# Patient Record
Sex: Female | Born: 1970 | Race: White | Hispanic: No | Marital: Married | Smoking: Never smoker
Health system: Southern US, Community
[De-identification: ages and names within clinical notes are randomized; demographics above are authoritative.]

## PROBLEM LIST (undated history)

## (undated) DIAGNOSIS — K635 Polyp of colon: Secondary | ICD-10-CM

## (undated) DIAGNOSIS — E7800 Pure hypercholesterolemia, unspecified: Secondary | ICD-10-CM

## (undated) HISTORY — DX: Pure hypercholesterolemia, unspecified: E78.00

---

## 2010-05-01 ENCOUNTER — Ambulatory Visit: Payer: Self-pay | Admitting: Oncology

## 2010-05-09 LAB — CBC WITH DIFFERENTIAL/PLATELET
Eosinophils Absolute: 0 10*3/uL (ref 0.0–0.5)
HGB: 14.3 g/dL (ref 11.6–15.9)
LYMPH%: 42 % (ref 14.0–49.7)
MONO#: 0.3 10*3/uL (ref 0.1–0.9)
NEUT#: 1.7 10*3/uL (ref 1.5–6.5)
Platelets: 234 10*3/uL (ref 145–400)
RBC: 4.37 10*6/uL (ref 3.70–5.45)
RDW: 12.1 % (ref 11.2–14.5)
WBC: 3.5 10*3/uL — ABNORMAL LOW (ref 3.9–10.3)

## 2010-05-09 LAB — PROTIME-INR
INR: 1 — ABNORMAL LOW (ref 2.00–3.50)
Protime: 12 Seconds (ref 10.6–13.4)

## 2010-05-10 LAB — COMPREHENSIVE METABOLIC PANEL
AST: 16 U/L (ref 0–37)
Albumin: 4.8 g/dL (ref 3.5–5.2)
Alkaline Phosphatase: 36 U/L — ABNORMAL LOW (ref 39–117)
Potassium: 4.3 mEq/L (ref 3.5–5.3)
Sodium: 139 mEq/L (ref 135–145)
Total Protein: 7.2 g/dL (ref 6.0–8.3)

## 2010-06-24 ENCOUNTER — Ambulatory Visit: Payer: Self-pay | Admitting: Oncology

## 2011-07-01 LAB — BASIC METABOLIC PANEL
BUN: 12 mg/dL (ref 4–21)
Creatinine: 0.7 mg/dL (ref 0.5–1.1)
Glucose: 90 mg/dL

## 2011-07-01 LAB — CBC AND DIFFERENTIAL
Platelets: 217 10*3/uL (ref 150–399)
WBC: 3.1 10^3/mL

## 2011-07-15 ENCOUNTER — Encounter (INDEPENDENT_AMBULATORY_CARE_PROVIDER_SITE_OTHER): Payer: Self-pay

## 2011-07-15 DIAGNOSIS — E7800 Pure hypercholesterolemia, unspecified: Secondary | ICD-10-CM | POA: Insufficient documentation

## 2011-07-15 DIAGNOSIS — R109 Unspecified abdominal pain: Secondary | ICD-10-CM | POA: Insufficient documentation

## 2011-07-31 ENCOUNTER — Ambulatory Visit (INDEPENDENT_AMBULATORY_CARE_PROVIDER_SITE_OTHER): Payer: Self-pay | Admitting: Internal Medicine

## 2013-06-21 ENCOUNTER — Encounter (INDEPENDENT_AMBULATORY_CARE_PROVIDER_SITE_OTHER): Payer: Self-pay | Admitting: *Deleted

## 2013-06-21 ENCOUNTER — Encounter (INDEPENDENT_AMBULATORY_CARE_PROVIDER_SITE_OTHER): Payer: Self-pay

## 2013-06-29 ENCOUNTER — Telehealth (INDEPENDENT_AMBULATORY_CARE_PROVIDER_SITE_OTHER): Payer: Self-pay | Admitting: *Deleted

## 2013-06-29 ENCOUNTER — Other Ambulatory Visit (INDEPENDENT_AMBULATORY_CARE_PROVIDER_SITE_OTHER): Payer: Self-pay | Admitting: *Deleted

## 2013-06-29 DIAGNOSIS — Z860100 Personal history of colon polyps, unspecified: Secondary | ICD-10-CM

## 2013-06-29 DIAGNOSIS — Z1211 Encounter for screening for malignant neoplasm of colon: Secondary | ICD-10-CM

## 2013-06-29 NOTE — Telephone Encounter (Signed)
Patient needs movi prep 

## 2013-07-05 MED ORDER — PEG-KCL-NACL-NASULF-NA ASC-C 100 G PO SOLR: 1.0000 | Freq: Once | ORAL | Status: DC

## 2013-07-12 ENCOUNTER — Telehealth (INDEPENDENT_AMBULATORY_CARE_PROVIDER_SITE_OTHER): Payer: Self-pay | Admitting: *Deleted

## 2013-07-12 NOTE — Telephone Encounter (Signed)
agree

## 2013-07-12 NOTE — Telephone Encounter (Signed)
  Procedure: tcs  Reason/Indication:  Hx polyps  Has patient had this procedure before?  2009 (scanned)  If so, when, by whom and where?    Is there a family history of colon cancer?  no  Who?  What age when diagnosed?    Is patient diabetic?   no      Does patient have prosthetic heart valve?  no  Do you have a pacemaker?  no  Has patient ever had endocarditis? no  Has patient had joint replacement within last 12 months?  no  Is patient on Coumadin, Plavix and/or Aspirin? no  Medications: altavera daily (birth control)  Allergies: nkda  Medication Adjustment:   Procedure date & time: 08/11/13 at 1200

## 2013-07-27 ENCOUNTER — Encounter (HOSPITAL_COMMUNITY): Payer: Self-pay | Admitting: Pharmacy Technician

## 2013-08-11 ENCOUNTER — Encounter (HOSPITAL_COMMUNITY): Payer: Self-pay | Admitting: *Deleted

## 2013-08-11 ENCOUNTER — Ambulatory Visit (HOSPITAL_COMMUNITY)
Admission: RE | Admit: 2013-08-11 | Discharge: 2013-08-11 | Disposition: A | Discharge status: Home / Self Care | Payer: BC Managed Care – PPO | Source: Ambulatory Visit | Attending: Internal Medicine | Admitting: Internal Medicine

## 2013-08-11 ENCOUNTER — Encounter (HOSPITAL_COMMUNITY)
Admission: RE | Disposition: A | Discharge status: Home / Self Care | Payer: Self-pay | Source: Ambulatory Visit | Attending: Internal Medicine

## 2013-08-11 DIAGNOSIS — D126 Benign neoplasm of colon, unspecified: Secondary | ICD-10-CM | POA: Insufficient documentation

## 2013-08-11 DIAGNOSIS — Z860100 Personal history of colon polyps, unspecified: Secondary | ICD-10-CM

## 2013-08-11 DIAGNOSIS — E7800 Pure hypercholesterolemia, unspecified: Secondary | ICD-10-CM | POA: Insufficient documentation

## 2013-08-11 HISTORY — PX: COLONOSCOPY: SHX5424

## 2013-08-11 HISTORY — DX: Polyp of colon: K63.5

## 2013-08-11 SURGERY — COLONOSCOPY
Anesthesia: Moderate Sedation

## 2013-08-11 MED ORDER — SODIUM CHLORIDE 0.9 % IV SOLN
INTRAVENOUS | Status: DC
Start: 2013-08-11 — End: 2013-08-11
  Administered 2013-08-11: 11:00:00 via INTRAVENOUS

## 2013-08-11 MED ORDER — MEPERIDINE HCL 50 MG/ML IJ SOLN
INTRAMUSCULAR | Status: AC
  Filled 2013-08-11: qty 1

## 2013-08-11 MED ORDER — MIDAZOLAM HCL 5 MG/5ML IJ SOLN
INTRAMUSCULAR | Status: AC
  Filled 2013-08-11: qty 10

## 2013-08-11 MED ORDER — STERILE WATER FOR IRRIGATION IR SOLN
Status: DC | PRN
  Administered 2013-08-11: 12:00:00

## 2013-08-11 MED ORDER — MIDAZOLAM HCL 5 MG/5ML IJ SOLN
INTRAMUSCULAR | Status: DC | PRN
  Administered 2013-08-11 (×2): 2 mg via INTRAVENOUS
  Administered 2013-08-11 (×3): 1 mg via INTRAVENOUS

## 2013-08-11 MED ORDER — MEPERIDINE HCL 50 MG/ML IJ SOLN
INTRAMUSCULAR | Status: DC | PRN
  Administered 2013-08-11 (×2): 25 mg via INTRAVENOUS

## 2013-08-11 NOTE — Op Note (Signed)
COLONOSCOPY PROCEDURE REPORT  PATIENT:  Joanna Villa  MR#:  [id number] Birthdate:  [birth year], 42 y.o., female Endoscopist:  Dr. Malissa Hippo, MD Referred By:  Dr. Ignatius Specking, MD Procedure Date: 08/11/2013  Procedure:   Colonoscopy  Indications:  Patient is 42 year old Caucasian female who underwent colonoscopy for hematochezia 5 years ago and was found to have small periappendiceal polyp. It was ablated via cold biopsy it turned out to be tubular adenoma. She is returning for surveillance colonoscopy.  Informed Consent:  The procedure and risks were reviewed with the patient and informed consent was obtained.  Medications:  Demerol 50 mg IV Versed 7 mg IV  Description of procedure:  After a digital rectal exam was performed, that colonoscope was advanced from the anus through the rectum and colon to the area of the cecum, ileocecal valve and appendiceal orifice. The cecum was deeply intubated. These structures were well-seen and photographed for the record. From the level of the cecum and ileocecal valve, the scope was slowly and cautiously withdrawn. The mucosal surfaces were carefully surveyed utilizing scope tip to flexion to facilitate fold flattening as needed. The scope was pulled down into the rectum where a thorough exam including retroflexion was performed.  Findings:   Prep excellent. Small polyp located close to appendiceal orifice. This polyp was felt to be possibly recurrent polyp. Polyp was ablated via cold biopsy. Site was coagulated with snare tip. Rest of the mucosa was normal. Normal rectal mucosa. Small hemorrhoids below the dentate line.   Therapeutic/Diagnostic Maneuvers Performed:  See above  Complications:  None  Cecal Withdrawal Time:  25 minutes  Impression:  Examination performed to cecum. Small periappendiceal polyp treated with combination of cold biopsy and coagulation. This polyp may have recurred at previous site due to incomplete excision  because of close proximity to appendiceal orifice  Recommendations:  Standard instructions given. I will contact patient with biopsy results and further recommendations.  REHMAN,NAJEEB U  08/11/2013 12:34 PM  CC: Dr. Ignatius Specking., MD & Dr. Bonnetta Barry ref. provider found

## 2013-08-11 NOTE — H&P (Signed)
Joanna Villa is an 42 y.o. female.   Chief Complaint: Patient's here for colonoscopy. HPI: Patient is 42 year old Caucasian female who was found to have small cecal adenoma on colonoscopy in July 2009. She is therefore returning for surveillance colonoscopy. She denies abdominal pain change in her bowel habits or rectal bleeding. Family history is negative for colorectal carcinoma.  Past Medical History  Diagnosis Date  . Hypercholesterolemia   . Colon polyps     Past Surgical History  Procedure Laterality Date  . Cesarean section      Family History  Problem Relation Age of Onset  . Colon cancer Neg Hx    Social History:  reports that she has never smoked. She has never used smokeless tobacco. She reports that she does not drink alcohol or use illicit drugs.  Allergies: No Known Allergies    No results found for this or any previous visit (from the past 48 hour(s)). No results found.  ROS  Blood pressure 115/76, pulse 77, temperature 97.7 F (36.5 C), temperature source Oral, resp. rate 16, height 5\' 4"  (1.626 m), weight 113 lb (51.256 kg), last menstrual period 08/02/2013, SpO2 100.00%. Physical Exam  Constitutional:  Well-developed thin Caucasian female in NAD  HENT:  Mouth/Throat: Oropharynx is clear and moist.  Eyes: Conjunctivae are normal. No scleral icterus.  Neck: No thyromegaly present.  Cardiovascular: Normal rate, regular rhythm and normal heart sounds.   No murmur heard. Respiratory: Effort normal and breath sounds normal.  GI: Soft. She exhibits no distension and no mass. There is no tenderness.  Musculoskeletal: She exhibits no edema.  Lymphadenopathy:    She has no cervical adenopathy.  Neurological: She is alert.  Skin: Skin is warm and dry.     Assessment/Plan History of cecal tubular adenoma.  Surveillance colonoscopy.  REHMAN,NAJEEB U 08/11/2013, 11:35 AM

## 2013-08-15 ENCOUNTER — Encounter (HOSPITAL_COMMUNITY): Payer: Self-pay | Admitting: Internal Medicine

## 2013-08-16 ENCOUNTER — Encounter (INDEPENDENT_AMBULATORY_CARE_PROVIDER_SITE_OTHER): Payer: Self-pay | Admitting: *Deleted

## 2019-07-21 ENCOUNTER — Ambulatory Visit (INDEPENDENT_AMBULATORY_CARE_PROVIDER_SITE_OTHER): Payer: BC Managed Care – PPO | Admitting: Otolaryngology

## 2019-07-21 DIAGNOSIS — R0982 Postnasal drip: Secondary | ICD-10-CM

## 2019-07-21 DIAGNOSIS — H6983 Other specified disorders of Eustachian tube, bilateral: Secondary | ICD-10-CM

## 2019-09-21 ENCOUNTER — Other Ambulatory Visit: Payer: Self-pay | Admitting: Unknown Physician Specialty

## 2019-09-21 DIAGNOSIS — R921 Mammographic calcification found on diagnostic imaging of breast: Secondary | ICD-10-CM

## 2019-09-22 ENCOUNTER — Ambulatory Visit (INDEPENDENT_AMBULATORY_CARE_PROVIDER_SITE_OTHER): Payer: BC Managed Care – PPO | Admitting: Otolaryngology

## 2019-09-22 DIAGNOSIS — H9209 Otalgia, unspecified ear: Secondary | ICD-10-CM

## 2019-09-22 DIAGNOSIS — J310 Chronic rhinitis: Secondary | ICD-10-CM | POA: Diagnosis not present

## 2019-09-29 DIAGNOSIS — C801 Malignant (primary) neoplasm, unspecified: Secondary | ICD-10-CM

## 2019-09-29 HISTORY — DX: Malignant (primary) neoplasm, unspecified: C80.1

## 2019-10-05 ENCOUNTER — Ambulatory Visit
Admission: RE | Admit: 2019-10-05 | Discharge: 2019-10-05 | Disposition: A | Discharge status: Home / Self Care | Payer: BC Managed Care – PPO | Source: Ambulatory Visit | Attending: Unknown Physician Specialty | Admitting: Unknown Physician Specialty

## 2019-10-05 ENCOUNTER — Other Ambulatory Visit: Payer: Self-pay

## 2019-10-05 DIAGNOSIS — R921 Mammographic calcification found on diagnostic imaging of breast: Secondary | ICD-10-CM

## 2019-11-29 HISTORY — PX: BREAST LUMPECTOMY: SHX2

## 2020-07-19 ENCOUNTER — Encounter (INDEPENDENT_AMBULATORY_CARE_PROVIDER_SITE_OTHER): Payer: Self-pay | Admitting: *Deleted

## 2020-08-14 ENCOUNTER — Other Ambulatory Visit (INDEPENDENT_AMBULATORY_CARE_PROVIDER_SITE_OTHER): Payer: Self-pay | Admitting: *Deleted

## 2020-08-14 DIAGNOSIS — Z86010 Personal history of colonic polyps: Secondary | ICD-10-CM

## 2020-09-15 IMAGING — MG MM BREAST LOCALIZATION CLIP
4 series · 4 of 12 positions shown · non-contrast
Comparison: Previous exam(s).

CLINICAL DATA: Post stereotactic guided biopsy of suspicious
calcifications in the upper slightly inner right breast.

EXAM:
DIAGNOSTIC RIGHT MAMMOGRAM POST STEREOTACTIC BIOPSY

[R ML synth-2D]
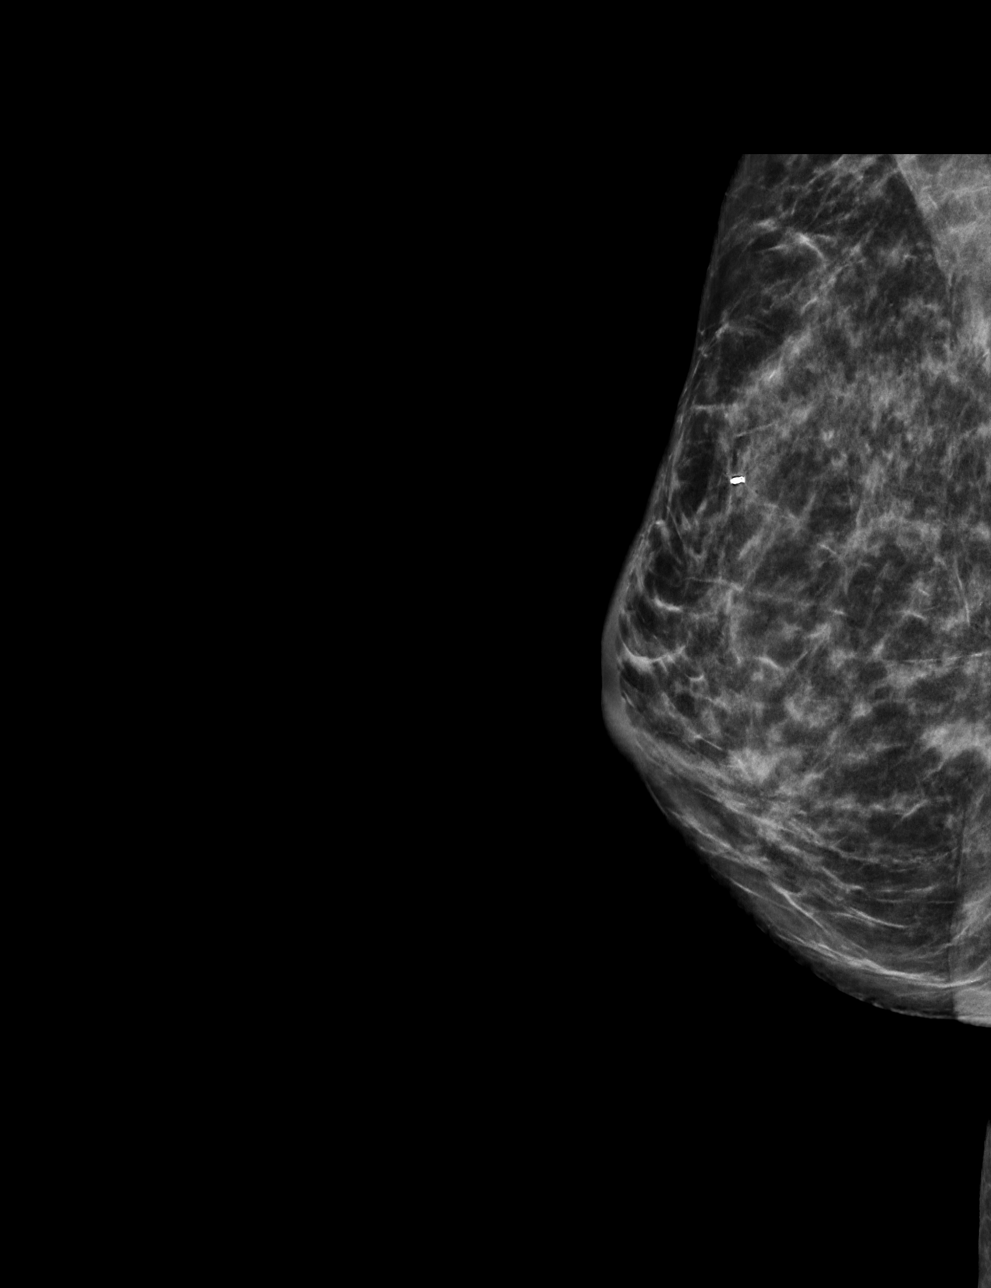

[R CC synth-2D]
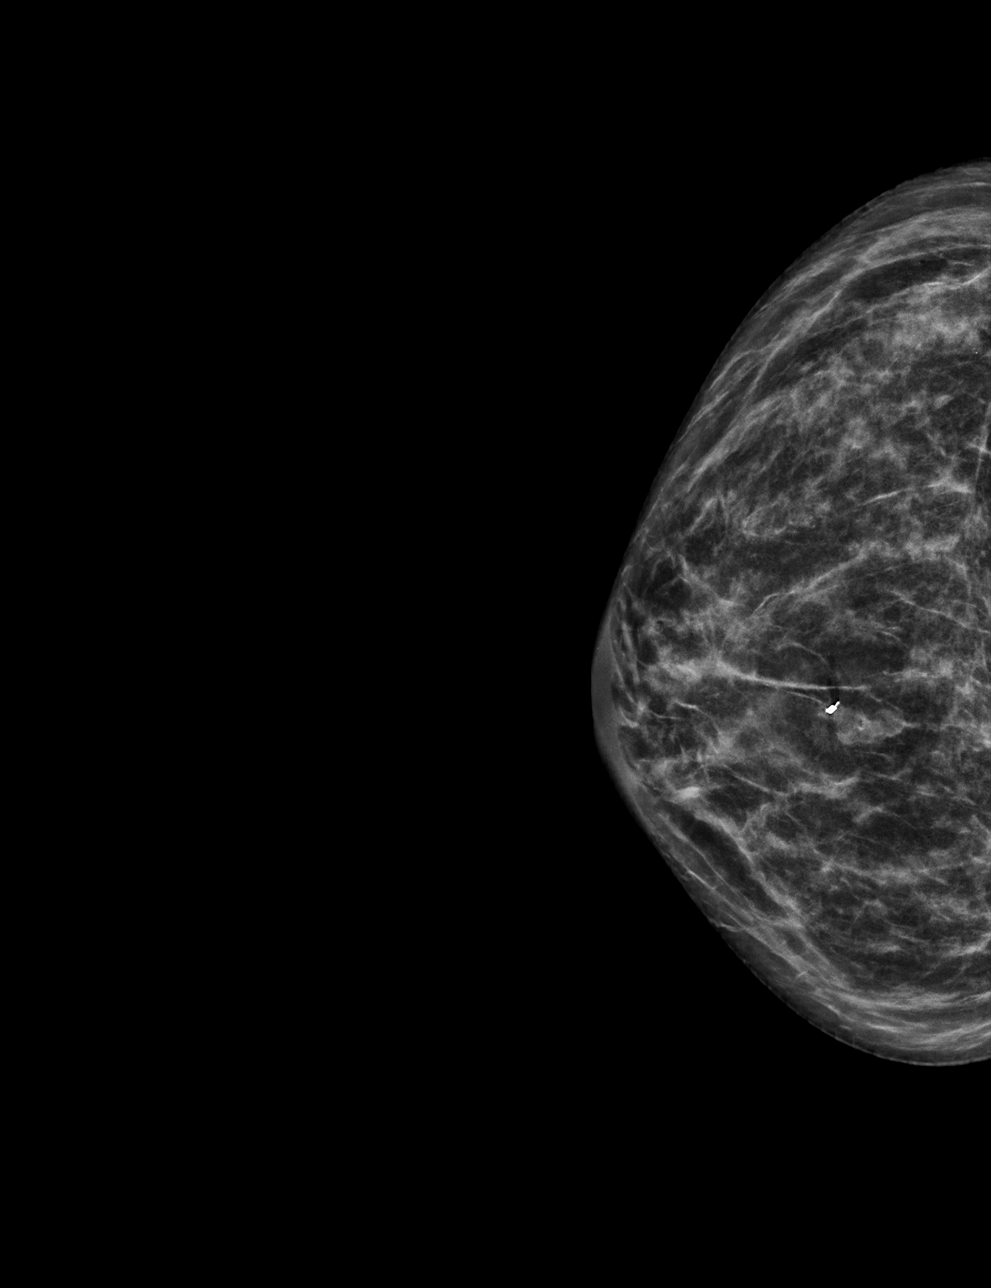

[R ML tomo · tomo slice 31/61.0]
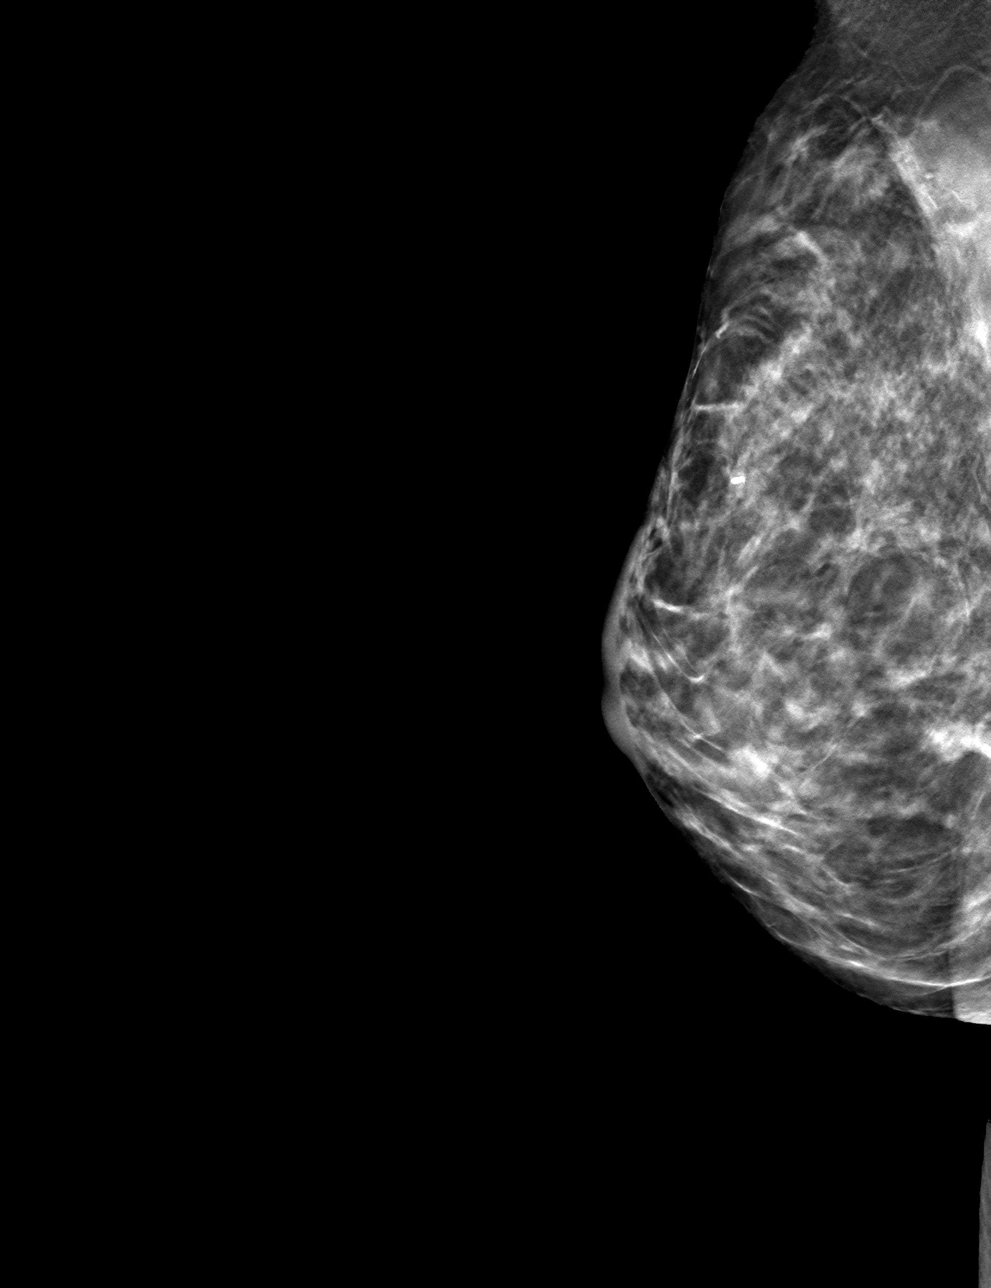

[R CC tomo · tomo slice 29/57.0]
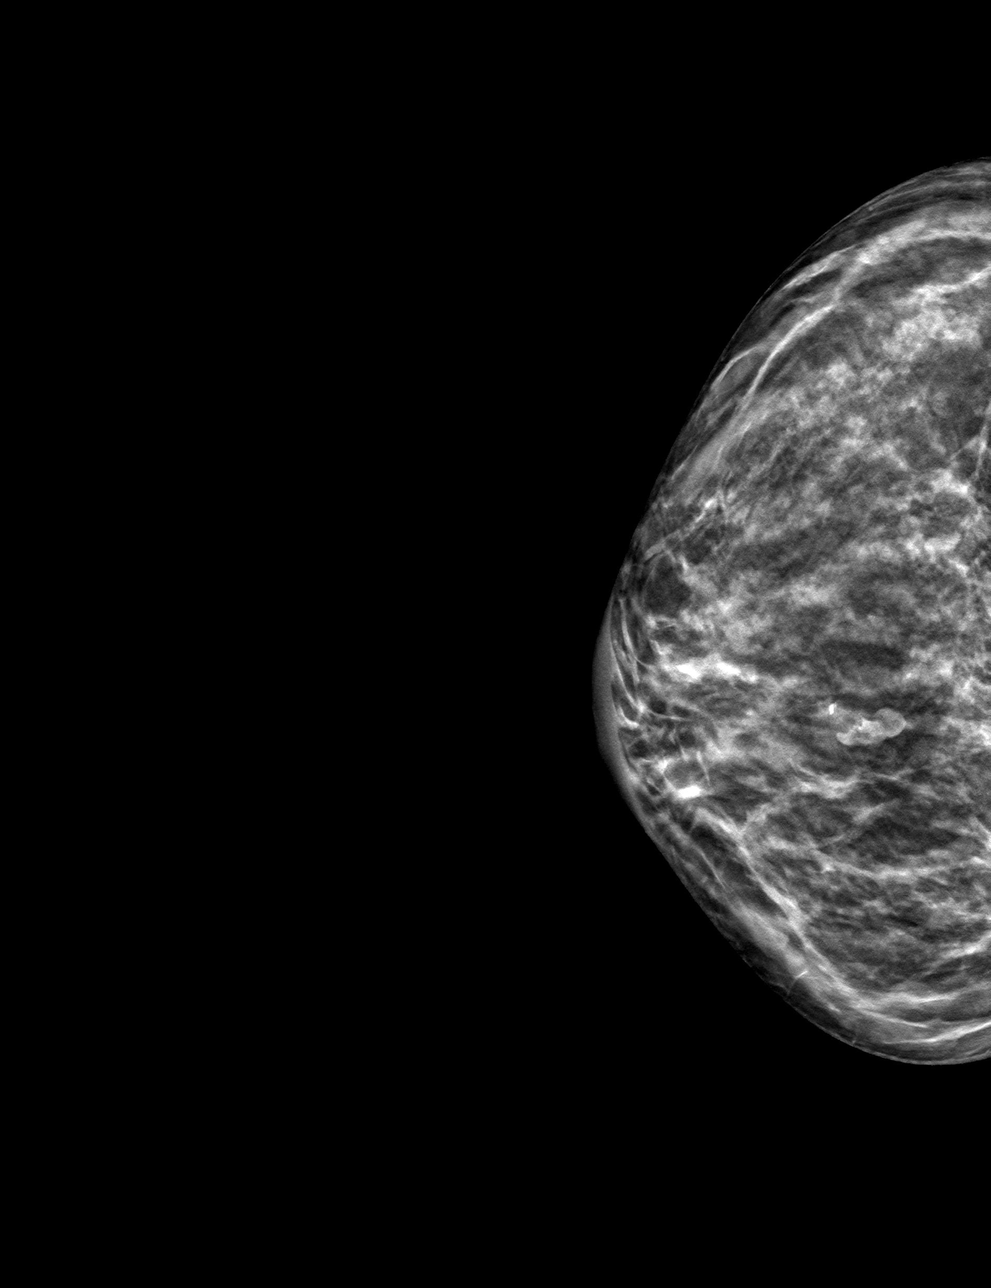

[4 of 12 positions shown; findings below may reference images not displayed]

FINDINGS: Mammographic images were obtained following stereotactic guided
biopsy of a 0.2 cm group of calcifications in the upper slightly
inner right breast. A coil shaped biopsy marking clip is present at
the site of the biopsied calcifications in the upper slightly inner
right breast.
IMPRESSION: Coil shaped biopsy marking clip at site of biopsied calcifications
in the upper slightly inner right breast.

Final Assessment: Post Procedure Mammograms for Marker Placement

## 2020-09-18 ENCOUNTER — Telehealth (INDEPENDENT_AMBULATORY_CARE_PROVIDER_SITE_OTHER): Payer: Self-pay | Admitting: *Deleted

## 2020-09-18 ENCOUNTER — Encounter (INDEPENDENT_AMBULATORY_CARE_PROVIDER_SITE_OTHER): Payer: Self-pay | Admitting: *Deleted

## 2020-09-18 MED ORDER — SUTAB 1479-225-188 MG PO TABS: 1.0000 | ORAL_TABLET | Freq: Once | ORAL | 0 refills | Status: AC

## 2020-09-18 NOTE — Telephone Encounter (Signed)
Patient needs Sutab (copay card) ° °

## 2020-09-18 NOTE — Telephone Encounter (Signed)
Referring MD/PCP: vyas   Procedure: tcs w mac  Reason/Indication:  Hx polyps  Has patient had this procedure before?  Yes, 2014  If so, when, by whom and where?    Is there a family history of colon cancer?  no  Who?  What age when diagnosed?    Is patient diabetic?   no      Does patient have prosthetic heart valve or mechanical valve?  no  Do you have a pacemaker/defibrillator?  no  Has patient ever had endocarditis/atrial fibrillation? no  Does patient use oxygen? no  Has patient had joint replacement within last 12 months?  no  Is patient constipated or do they take laxatives? no  Does patient have a history of alcohol/drug use?  no  Is patient on blood thinner such as Coumadin, Plavix and/or Aspirin? no  Medications: tomaxifen 20 mg daily  Allergies: pcn  Medication Adjustment per Dr Rehman/Dr Jenetta Downer   Procedure date & time: 10/17/20

## 2020-10-12 NOTE — Patient Instructions (Signed)
[facility]  10/12/2020     @PREFPERIOPPHARMACY @   Your procedure is scheduled on 10/20/52021.  Report to [facility] at  [phone]  A.M.  Call this number if you have problems the morning of surgery:  [phone]   Remember:  Follow the diet and prep instructions given to you by the office.                      Take these medicines the morning of surgery with A SIP OF WATER None    Do not wear jewelry, make-up or nail polish.  Do not wear lotions, powders, or perfumes. Please wear deodorant and brush your teeth.  Do not shave 48 hours prior to surgery.  Men may shave face and neck.  Do not bring valuables to the hospital.  [facility] is not responsible for any belongings or valuables.  Contacts, dentures or bridgework may not be worn into surgery.  Leave your suitcase in the car.  After surgery it may be brought to your room.  For patients admitted to the hospital, discharge time will be determined by your treatment team.  Patients discharged the day of surgery will not be allowed to drive home.   Name and phone number of your driver:   family Special instructions:   DO NOT smoke the day of your procedure.  Please read over the following fact sheets that you were given. Anesthesia Post-op Instructions and Care and Recovery After Surgery       Colonoscopy, Adult, Care After This sheet gives you information about how to care for yourself after your procedure. Your health care provider may also give you more specific instructions. If you have problems or questions, contact your health care provider. What can I expect after the procedure? After the procedure, it is common to have:  A small amount of blood in your stool for 24 hours after the procedure.  Some gas.  Mild cramping or bloating of your abdomen. Follow these instructions at home: Eating and drinking   Drink enough fluid to keep your urine pale yellow.  Follow instructions from your  health care provider about eating or drinking restrictions.  Resume your normal diet as instructed by your health care provider. Avoid heavy or fried foods that are hard to digest. Activity  Rest as told by your health care provider.  Avoid sitting for a long time without moving. Get up to take short walks every 1-2 hours. This is important to improve blood flow and breathing. Ask for help if you feel weak or unsteady.  Return to your normal activities as told by your health care provider. Ask your health care provider what activities are safe for you. Managing cramping and bloating   Try walking around when you have cramps or feel bloated.  Apply heat to your abdomen as told by your health care provider. Use the heat source that your health care provider recommends, such as a moist heat pack or a heating pad. ? Place a towel between your skin and the heat source. ? Leave the heat on for 20-30 minutes. ? Remove the heat if your skin turns bright red. This is especially important if you are unable to feel pain, heat, or cold. You may have a greater risk of getting burned. General instructions  For the first 24 hours after the procedure: ? Do not drive or use machinery. ? Do not sign important documents. ?  Do not drink alcohol. ? Do your regular daily activities at a slower pace than normal. ? Eat soft foods that are easy to digest.  Take over-the-counter and prescription medicines only as told by your health care provider.  Keep all follow-up visits as told by your health care provider. This is important. Contact a health care provider if:  You have blood in your stool 2-3 days after the procedure. Get help right away if you have:  More than a small spotting of blood in your stool.  Large blood clots in your stool.  Swelling of your abdomen.  Nausea or vomiting.  A fever.  Increasing pain in your abdomen that is not relieved with medicine. Summary  After the procedure,  it is common to have a small amount of blood in your stool. You may also have mild cramping and bloating of your abdomen.  For the first 24 hours after the procedure, do not drive or use machinery, sign important documents, or drink alcohol.  Get help right away if you have a lot of blood in your stool, nausea or vomiting, a fever, or increased pain in your abdomen. This information is not intended to replace advice given to you by your health care provider. Make sure you discuss any questions you have with your health care provider. Document Revised: 07/11/2019 Document Reviewed: 07/11/2019 Elsevier Patient Education  [place] After These instructions provide you with information about caring for yourself after your procedure. Your health care provider may also give you more specific instructions. Your treatment has been planned according to current medical practices, but problems sometimes occur. Call your health care provider if you have any problems or questions after your procedure. What can I expect after the procedure? After your procedure, you may:  Feel sleepy for several hours.  Feel clumsy and have poor balance for several hours.  Feel forgetful about what happened after the procedure.  Have poor judgment for several hours.  Feel nauseous or vomit.  Have a sore throat if you had a breathing tube during the procedure. Follow these instructions at home: For at least 24 hours after the procedure:      Have a responsible adult stay with you. It is important to have someone help care for you until you are awake and alert.  Rest as needed.  Do not: ? Participate in activities in which you could fall or become injured. ? Drive. ? Use heavy machinery. ? Drink alcohol. ? Take sleeping pills or medicines that cause drowsiness. ? Make important decisions or sign legal documents. ? Take care of children on your own. Eating and  drinking  Follow the diet that is recommended by your health care provider.  If you vomit, drink water, juice, or soup when you can drink without vomiting.  Make sure you have little or no nausea before eating solid foods. General instructions  Take over-the-counter and prescription medicines only as told by your health care provider.  If you have sleep apnea, surgery and certain medicines can increase your risk for breathing problems. Follow instructions from your health care provider about wearing your sleep device: ? Anytime you are sleeping, including during daytime naps. ? While taking prescription pain medicines, sleeping medicines, or medicines that make you drowsy.  If you smoke, do not smoke without supervision.  Keep all follow-up visits as told by your health care provider. This is important. Contact a health care provider if:  You keep  feeling nauseous or you keep vomiting.  You feel light-headed.  You develop a rash.  You have a fever. Get help right away if:  You have trouble breathing. Summary  For several hours after your procedure, you may feel sleepy and have poor judgment.  Have a responsible adult stay with you for at least 24 hours or until you are awake and alert. This information is not intended to replace advice given to you by your health care provider. Make sure you discuss any questions you have with your health care provider. Document Revised: 03/15/2018 Document Reviewed: 04/06/2016 Elsevier Patient Education  [place].

## 2020-10-15 ENCOUNTER — Encounter (HOSPITAL_COMMUNITY): Payer: Self-pay

## 2020-10-15 ENCOUNTER — Other Ambulatory Visit: Payer: Self-pay

## 2020-10-15 ENCOUNTER — Encounter (HOSPITAL_COMMUNITY)
Admission: RE | Admit: 2020-10-15 | Discharge: 2020-10-15 | Disposition: A | Discharge status: Home / Self Care | Payer: BC Managed Care – PPO | Source: Ambulatory Visit | Attending: Internal Medicine | Admitting: Internal Medicine

## 2020-10-15 ENCOUNTER — Other Ambulatory Visit (HOSPITAL_COMMUNITY)
Admission: RE | Admit: 2020-10-15 | Discharge: 2020-10-15 | Disposition: A | Discharge status: Home / Self Care | Payer: BC Managed Care – PPO | Source: Ambulatory Visit | Attending: Internal Medicine | Admitting: Internal Medicine

## 2020-10-15 DIAGNOSIS — Z20822 Contact with and (suspected) exposure to covid-19: Secondary | ICD-10-CM | POA: Diagnosis not present

## 2020-10-15 DIAGNOSIS — Z01812 Encounter for preprocedural laboratory examination: Secondary | ICD-10-CM | POA: Diagnosis present

## 2020-10-15 LAB — PREGNANCY, URINE: Preg Test, Ur: NEGATIVE

## 2020-10-16 LAB — SARS CORONAVIRUS 2 (TAT 6-24 HRS): SARS Coronavirus 2: NEGATIVE

## 2020-10-17 ENCOUNTER — Encounter (HOSPITAL_COMMUNITY): Payer: Self-pay | Admitting: Internal Medicine

## 2020-10-17 ENCOUNTER — Encounter (HOSPITAL_COMMUNITY)
Admission: RE | Disposition: A | Discharge status: Home / Self Care | Payer: Self-pay | Source: Home / Self Care | Attending: Internal Medicine

## 2020-10-17 ENCOUNTER — Ambulatory Visit (HOSPITAL_COMMUNITY): Payer: BC Managed Care – PPO | Admitting: Anesthesiology

## 2020-10-17 ENCOUNTER — Ambulatory Visit (HOSPITAL_COMMUNITY)
Admission: RE | Admit: 2020-10-17 | Discharge: 2020-10-17 | Disposition: A | Discharge status: Home / Self Care | Payer: BC Managed Care – PPO | Attending: Internal Medicine | Admitting: Internal Medicine

## 2020-10-17 DIAGNOSIS — Z880 Allergy status to penicillin: Secondary | ICD-10-CM | POA: Diagnosis not present

## 2020-10-17 DIAGNOSIS — K644 Residual hemorrhoidal skin tags: Secondary | ICD-10-CM | POA: Insufficient documentation

## 2020-10-17 DIAGNOSIS — Z86010 Personal history of colonic polyps: Secondary | ICD-10-CM | POA: Diagnosis not present

## 2020-10-17 DIAGNOSIS — D120 Benign neoplasm of cecum: Secondary | ICD-10-CM | POA: Insufficient documentation

## 2020-10-17 DIAGNOSIS — E7800 Pure hypercholesterolemia, unspecified: Secondary | ICD-10-CM | POA: Insufficient documentation

## 2020-10-17 DIAGNOSIS — Z79899 Other long term (current) drug therapy: Secondary | ICD-10-CM | POA: Insufficient documentation

## 2020-10-17 DIAGNOSIS — Z853 Personal history of malignant neoplasm of breast: Secondary | ICD-10-CM | POA: Insufficient documentation

## 2020-10-17 DIAGNOSIS — Z1211 Encounter for screening for malignant neoplasm of colon: Secondary | ICD-10-CM | POA: Diagnosis present

## 2020-10-17 HISTORY — PX: COLONOSCOPY WITH PROPOFOL: SHX5780

## 2020-10-17 HISTORY — PX: POLYPECTOMY: SHX5525

## 2020-10-17 LAB — HM COLONOSCOPY

## 2020-10-17 SURGERY — COLONOSCOPY WITH PROPOFOL
Anesthesia: General

## 2020-10-17 MED ORDER — CHLORHEXIDINE GLUCONATE CLOTH 2 % EX PADS: 6.0000 | MEDICATED_PAD | Freq: Once | CUTANEOUS | Status: DC

## 2020-10-17 MED ORDER — PROPOFOL 500 MG/50ML IV EMUL
INTRAVENOUS | Status: DC | PRN
  Administered 2020-10-17: 150 ug/kg/min via INTRAVENOUS

## 2020-10-17 MED ORDER — PROPOFOL 10 MG/ML IV BOLUS
INTRAVENOUS | Status: DC | PRN
  Administered 2020-10-17: 50 mg via INTRAVENOUS

## 2020-10-17 MED ORDER — LACTATED RINGERS IV SOLN
INTRAVENOUS | Status: DC
  Administered 2020-10-17: 1000 mL via INTRAVENOUS

## 2020-10-17 MED ORDER — PROPOFOL 10 MG/ML IV BOLUS
INTRAVENOUS | Status: AC
  Filled 2020-10-17: qty 40

## 2020-10-17 NOTE — Op Note (Signed)
[facility] Patient Name: Joanna Villa Procedure Date: 10/17/2020 7:16 AM MRN: [id number] Date of Birth: 1971/08/31 Attending MD: Hildred Laser , MD CSN: [id number] Age: 49 Admit Type: Outpatient Procedure:                Colonoscopy Indications:              High risk colon cancer surveillance: Personal                            history of colonic polyps Providers:                Hildred Laser, MD, Otis Peak B. Sharon Seller, RN, Raphael Gibney, Technician Referring MD:             Suzie Portela MD Medicines:                Propofol per Anesthesia Complications:            No immediate complications. Estimated Blood Loss:     Estimated blood loss was minimal. Procedure:                Pre-Anesthesia Assessment:                           - Prior to the procedure, a History and Physical                            was performed, and patient medications and                            allergies were reviewed. The patient's tolerance of                            previous anesthesia was also reviewed. The risks                            and benefits of the procedure and the sedation                            options and risks were discussed with the patient.                            All questions were answered, and informed consent                            was obtained. Prior Anticoagulants: The patient has                            taken no previous anticoagulant or antiplatelet                            agents. ASA Grade Assessment: II - A patient with  mild systemic disease. After reviewing the risks                            and benefits, the patient was deemed in                            satisfactory condition to undergo the procedure.                           After obtaining informed consent, the colonoscope                            was passed under direct vision. Throughout the                             procedure, the patient's blood pressure, pulse, and                            oxygen saturations were monitored continuously. The                            [id number]-[id number] ([id number]) scope was introduced through                            the anus and advanced to the the cecum, identified                            by appendiceal orifice and ileocecal valve. The                            colonoscopy was performed without difficulty. The                            patient tolerated the procedure well. The quality                            of the bowel preparation was excellent. The                            ileocecal valve, appendiceal orifice, and rectum                            were photographed. Scope In: 7:36:45 AM Scope Out: 7:52:45 AM Scope Withdrawal Time: 0 hours 8 minutes 2 seconds  Total Procedure Duration: 0 hours 16 minutes 0 seconds  Findings:      The perianal and digital rectal examinations were normal.      A 3 to 6 mm polyp was found in the cecum. The polyp was sessile. The       polyp was removed with a cold snare. Resection and retrieval were       complete.      The exam was otherwise normal throughout the examined colon.      External hemorrhoids were found during retroflexion. The hemorrhoids       were small. Impression:               -  One 3 to 6 mm polyp in the cecum, removed with a                            cold snare. Resected and retrieved.                           - External hemorrhoids.                           Comment: No residual polyp noted at appendiceal                            orifice. Moderate Sedation:      Per Anesthesia Care Recommendation:           - Patient has a contact number available for                            emergencies. The signs and symptoms of potential                            delayed complications were discussed with the                            patient. Return to normal activities tomorrow.                             Written discharge instructions were provided to the                            patient.                           - Resume previous diet today.                           - Continue present medications.                           - No aspirin, ibuprofen, naproxen, or other                            non-steroidal anti-inflammatory drugs for 1 day.                           - Await pathology results.                           - Repeat colonoscopy is recommended. The                            colonoscopy date will be determined after pathology                            results from today's exam become available for  review. Procedure Code(s):        --- Professional ---                           [id number], Colonoscopy, flexible; with removal of                            tumor(s), polyp(s), or other lesion(s) by snare                            technique Diagnosis Code(s):        --- Professional ---                           Z86.010, Personal history of colonic polyps                           K63.5, Polyp of colon                           K64.4, Residual hemorrhoidal skin tags CPT copyright 2019 American Medical Association. All rights reserved. The codes documented in this report are preliminary and upon coder review may  be revised to meet current compliance requirements. Hildred Laser, MD Hildred Laser, MD 10/17/2020 8:00:30 AM This report has been signed electronically. Number of Addenda: 0

## 2020-10-17 NOTE — Anesthesia Preprocedure Evaluation (Signed)
Anesthesia Evaluation  Patient identified by MRN, date of birth, ID band Patient awake    Reviewed: Allergy & Precautions, H&P , NPO status , Patient's Chart, lab work & pertinent test results, reviewed documented beta blocker date and time   Airway Mallampati: II  TM Distance: >3 FB Neck ROM: full    Dental no notable dental hx.    Pulmonary neg pulmonary ROS,    Pulmonary exam normal breath sounds clear to auscultation       Cardiovascular Exercise Tolerance: Good negative cardio ROS   Rhythm:regular Rate:Normal     Neuro/Psych negative neurological ROS  negative psych ROS   GI/Hepatic negative GI ROS, Neg liver ROS,   Endo/Other  negative endocrine ROS  Renal/GU negative Renal ROS  negative genitourinary   Musculoskeletal   Abdominal   Peds  Hematology negative hematology ROS (+)   Anesthesia Other Findings   Reproductive/Obstetrics negative OB ROS                             Anesthesia Physical Anesthesia Plan  ASA: II  Anesthesia Plan: General   Post-op Pain Management:    Induction:   PONV Risk Score and Plan: 3 and Propofol infusion  Airway Management Planned:   Additional Equipment:   Intra-op Plan:   Post-operative Plan:   Informed Consent: I have reviewed the patients History and Physical, chart, labs and discussed the procedure including the risks, benefits and alternatives for the proposed anesthesia with the patient or authorized representative who has indicated his/her understanding and acceptance.     Dental Advisory Given  Plan Discussed with: CRNA  Anesthesia Plan Comments:         Anesthesia Quick Evaluation

## 2020-10-17 NOTE — H&P (Signed)
Joanna Villa is an 49 y.o. female.   Chief Complaint: Patient is here for colonoscopy. HPI: Patient is 49 year old Caucasian female with history of cecal tubular adenoma who is here for surveillance colonoscopy.  This is her third exam.  Last exam was in August 2014 she was advised to return in 7 years.  She denies abdominal pain change in bowel habits or rectal bleeding. Family history is negative for CRC. Personal history is positive for breast carcinoma diagnosed last year.  She is in remission.  Past Medical History:  Diagnosis Date  . Cancer ([place]) breast 09/2019  . Colon polyps   . Hypercholesterolemia     Past Surgical History:  Procedure Laterality Date  . BREAST LUMPECTOMY  11/2019  . CESAREAN SECTION    . COLONOSCOPY N/A 08/11/2013   Procedure: COLONOSCOPY;  Surgeon: Rogene Houston, MD;  Location: [facility];  Service: Endoscopy;  Laterality: N/A;  1200    Family History  Problem Relation Age of Onset  . Colon cancer Neg Hx    Social History:  reports that she has never smoked. She has never used smokeless tobacco. She reports that she does not drink alcohol and does not use drugs.  Allergies:  Allergies  Allergen Reactions  . Penicillins Rash    Medications Prior to Admission  Medication Sig Dispense Refill  . benzocaine-menthol (CHLORASEPTIC) 6-10 MG lozenge Take 1 lozenge by mouth as needed for sore throat.    . Cholecalciferol (VITAMIN D) 50 MCG (2000 UT) tablet Take 2,000 Units by mouth 2 (two) times daily.    . clobetasol ointment (TEMOVATE) 5.36 % Apply 1 application topically 2 (two) times daily as needed (dry skin).     . Multiple Vitamin (MULTIVITAMIN WITH MINERALS) TABS tablet Take 1 tablet by mouth daily.    . tamoxifen (NOLVADEX) 20 MG tablet Take 20 mg by mouth daily.      Results for orders placed or performed during the hospital encounter of 10/15/20 (from the past 48 hour(s))  SARS CORONAVIRUS 2 (TAT 6-24 HRS) Nasopharyngeal  Nasopharyngeal Swab     Status: None   Collection Time: 10/15/20  2:48 PM   Specimen: Nasopharyngeal Swab  Result Value Ref Range   SARS Coronavirus 2 NEGATIVE NEGATIVE    Comment: (NOTE) SARS-CoV-2 target nucleic acids are NOT DETECTED.  The SARS-CoV-2 RNA is generally detectable in upper and lower respiratory specimens during the acute phase of infection. Negative results do not preclude SARS-CoV-2 infection, do not rule out co-infections with other pathogens, and should not be used as the sole basis for treatment or other patient management decisions. Negative results must be combined with clinical observations, patient history, and epidemiological information. The expected result is Negative.  Fact Sheet for Patients: SugarRoll.be  Fact Sheet for Healthcare Providers: https://www.woods-mathews.com/  This test is not yet approved or cleared by the [place] FDA and  has been authorized for detection and/or diagnosis of SARS-CoV-2 by FDA under an Emergency Use Authorization (EUA). This EUA will remain  in effect (meaning this test can be used) for the duration of the COVID-19 declaration under Se ction 564(b)(1) of the Act, 21 U.S.C. section 360bbb-3(b)(1), unless the authorization is terminated or revoked sooner.  Performed at [facility], [place] [address] [place], [place] [address]   Pregnancy, urine     Status: None   Collection Time: 10/15/20  3:18 PM  Result Value Ref Range   Preg Test, Ur NEGATIVE NEGATIVE  Comment:        THE SENSITIVITY OF THIS METHODOLOGY IS >20 mIU/mL. Performed at [facility], [address], [place], [place] [address]    No results found.  Review of Systems  Blood pressure 112/75, temperature 97.9 F (36.6 C), temperature source Oral, resp. rate 18, last menstrual period 10/15/2017, SpO2 100 %. Physical Exam Constitutional:      Comments: Patient is alert.  She is very thin.  HENT:      Mouth/Throat:     Mouth: Mucous membranes are moist.     Pharynx: Oropharynx is clear.  Eyes:     General: No scleral icterus.    Conjunctiva/sclera: Conjunctivae normal.  Cardiovascular:     Rate and Rhythm: Normal rate and regular rhythm.     Heart sounds: Normal heart sounds. No murmur heard.   Pulmonary:     Effort: Pulmonary effort is normal.     Breath sounds: Normal breath sounds.  Abdominal:     General: Abdomen is flat. There is no distension.     Palpations: There is no mass.     Tenderness: There is no abdominal tenderness.  Musculoskeletal:        General: No swelling.     Cervical back: Neck supple.  Lymphadenopathy:     Cervical: No cervical adenopathy.  Skin:    General: Skin is warm and dry.      Assessment/Plan  History of cecal tubular adenoma. Surveillance colonoscopy.  Hildred Laser, MD 10/17/2020, 7:22 AM

## 2020-10-17 NOTE — Anesthesia Postprocedure Evaluation (Signed)
Anesthesia Post Note  Patient: Psychologist, counselling) Performed: COLONOSCOPY WITH PROPOFOL (N/A ) POLYPECTOMY  Patient location during evaluation: PACU Anesthesia Type: General Level of consciousness: awake, oriented, awake and alert and patient cooperative Pain management: satisfactory to patient Vital Signs Assessment: post-procedure vital signs reviewed and stable Respiratory status: spontaneous breathing, respiratory function stable and nonlabored ventilation Cardiovascular status: stable Postop Assessment: no apparent nausea or vomiting Anesthetic complications: no   No complications documented.   Last Vitals:  Vitals:   10/17/20 0706  BP: 112/75  Resp: 18  Temp: 36.6 C  SpO2: 100%    Last Pain:  Vitals:   10/17/20 0732  TempSrc:   PainSc: 0-No pain                 Joanna Villa

## 2020-10-17 NOTE — Transfer of Care (Signed)
Immediate Anesthesia Transfer of Care Note  Patient: Joanna Villa  Procedure(s) Performed: COLONOSCOPY WITH PROPOFOL (N/A ) POLYPECTOMY  Patient Location: PACU  Anesthesia Type:General  Level of Consciousness: awake, alert , oriented and patient cooperative  Airway & Oxygen Therapy: Patient Spontanous Breathing  Post-op Assessment: Report given to RN, Post -op Vital signs reviewed and stable and Patient moving all extremities X 4  Post vital signs: Reviewed and stable  Last Vitals:  Vitals Value Taken Time  BP    Temp    Pulse 57 10/17/20 0759  Resp 21 10/17/20 0759  SpO2 99 % 10/17/20 0759  Vitals shown include unvalidated device data.  Last Pain:  Vitals:   10/17/20 0732  TempSrc:   PainSc: 0-No pain      Patients Stated Pain Goal: 8 (43/92/65 [id number])  Complications: No complications documented.

## 2020-10-17 NOTE — Discharge Instructions (Signed)
Colon Polyps  Polyps are tissue growths inside the body. Polyps can grow in many places, including the large intestine (colon). A polyp may be a round bump or a mushroom-shaped growth. You could have one polyp or several. Most colon polyps are noncancerous (benign). However, some colon polyps can become cancerous over time. Finding and removing the polyps early can help prevent this. What are the causes? The exact cause of colon polyps is not known. What increases the risk? You are more likely to develop this condition if you:  Have a family history of colon cancer or colon polyps.  Are older than 55 or older than 45 if you are African American.  Have inflammatory bowel disease, such as ulcerative colitis or Crohn's disease.  Have certain hereditary conditions, such as: ? Familial adenomatous polyposis. ? Lynch syndrome. ? Turcot syndrome. ? Peutz-Jeghers syndrome.  Are overweight.  Smoke cigarettes.  Do not get enough exercise.  Drink too much alcohol.  Eat a diet that is high in fat and red meat and low in fiber.  Had childhood cancer that was treated with abdominal radiation. What are the signs or symptoms? Most polyps do not cause symptoms. If you have symptoms, they may include:  Blood coming from your rectum when having a bowel movement.  Blood in your stool. The stool may look dark red or black.  Abdominal pain.  A change in bowel habits, such as constipation or diarrhea. How is this diagnosed? This condition is diagnosed with a colonoscopy. This is a procedure in which a lighted, flexible scope is inserted into the anus and then passed into the colon to examine the area. Polyps are sometimes found when a colonoscopy is done as part of routine cancer screening tests. How is this treated? Treatment for this condition involves removing any polyps that are found. Most polyps can be removed during a colonoscopy. Those polyps will then be tested for cancer. Additional  treatment may be needed depending on the results of testing. Follow these instructions at home: Lifestyle  Maintain a healthy weight, or lose weight if recommended by your health care provider.  Exercise every day or as told by your health care provider.  Do not use any products that contain nicotine or tobacco, such as cigarettes and e-cigarettes. If you need help quitting, ask your health care provider.  If you drink alcohol, limit how much you have: ? 0-1 drink a day for women. ? 0-2 drinks a day for men.  Be aware of how much alcohol is in your drink. In the [place] one drink equals one 12 oz bottle of beer (355 mL), one 5 oz glass of wine (148 mL), or one 1 oz shot of hard liquor (44 mL). Eating and drinking   Eat foods that are high in fiber, such as fruits, vegetables, and whole grains.  Eat foods that are high in calcium and vitamin D, such as milk, cheese, yogurt, eggs, liver, fish, and broccoli.  Limit foods that are high in fat, such as fried foods and desserts.  Limit the amount of red meat and processed meat you eat, such as hot dogs, sausage, bacon, and lunch meats. General instructions  Keep all follow-up visits as told by your health care provider. This is important. ? This includes having regularly scheduled colonoscopies. ? Talk to your health care provider about when you need a colonoscopy. Contact a health care provider if:  You have new or worsening bleeding during a bowel movement.  You  have new or increased blood in your stool.  You have a change in bowel habits.  You lose weight for no known reason. Summary  Polyps are tissue growths inside the body. Polyps can grow in many places, including the colon.  Most colon polyps are noncancerous (benign), but some can become cancerous over time.  This condition is diagnosed with a colonoscopy.  Treatment for this condition involves removing any polyps that are found. Most polyps can be removed during a  colonoscopy. This information is not intended to replace advice given to you by your health care provider. Make sure you discuss any questions you have with your health care provider. Document Revised: 04/01/2018 Document Reviewed: 04/01/2018 Elsevier Patient Education  [place]. No aspirin or NSAIDs for 24 hours. Resume usual medications and diet as before. No driving for 24 hours. Physician will call with biopsy results.

## 2020-10-18 LAB — SURGICAL PATHOLOGY

## 2020-10-22 ENCOUNTER — Encounter (HOSPITAL_COMMUNITY): Payer: Self-pay | Admitting: Internal Medicine

## 2020-12-06 ENCOUNTER — Encounter (INDEPENDENT_AMBULATORY_CARE_PROVIDER_SITE_OTHER): Payer: Self-pay | Admitting: *Deleted
